# Patient Record
Sex: Female | Born: 1980 | Race: White | Hispanic: No | Marital: Married | Smoking: Never smoker
Health system: Southern US, Community
[De-identification: ages and names within clinical notes are randomized; demographics above are authoritative.]

---

## 2013-06-25 ENCOUNTER — Ambulatory Visit: Payer: Self-pay | Admitting: Physician Assistant

## 2013-06-25 LAB — RAPID STREP-A WITH REFLX: MICRO TEXT REPORT: NEGATIVE

## 2013-06-25 LAB — RAPID INFLUENZA A&B ANTIGENS

## 2013-06-27 ENCOUNTER — Emergency Department: Payer: Self-pay | Admitting: Emergency Medicine

## 2013-06-27 LAB — URINALYSIS, COMPLETE
BILIRUBIN, UR: NEGATIVE
Blood: NEGATIVE
Glucose,UR: NEGATIVE mg/dL (ref 0–75)
Ketone: NEGATIVE
Nitrite: NEGATIVE
Ph: 5 (ref 4.5–8.0)
Protein: 30
RBC,UR: 2 /HPF (ref 0–5)
SPECIFIC GRAVITY: 1.026 (ref 1.003–1.030)
Squamous Epithelial: 11
WBC UR: 8 /HPF (ref 0–5)

## 2013-06-27 LAB — BASIC METABOLIC PANEL
Anion Gap: 5 — ABNORMAL LOW (ref 7–16)
BUN: 8 mg/dL (ref 7–18)
Calcium, Total: 8.1 mg/dL — ABNORMAL LOW (ref 8.5–10.1)
Chloride: 106 mmol/L (ref 98–107)
Co2: 27 mmol/L (ref 21–32)
Creatinine: 0.75 mg/dL (ref 0.60–1.30)
EGFR (African American): >60
Glucose: 96 mg/dL (ref 65–99)
Osmolality: 274 (ref 275–301)
Potassium: 3.5 mmol/L (ref 3.5–5.1)
Sodium: 138 mmol/L (ref 136–145)

## 2013-06-27 LAB — CBC
HCT: 34.7 % — AB (ref 35.0–47.0)
HGB: 12.1 g/dL (ref 12.0–16.0)
MCH: 30.6 pg (ref 26.0–34.0)
MCHC: 34.8 g/dL (ref 32.0–36.0)
MCV: 88 fL (ref 80–100)
Platelet: 130 10*3/uL — ABNORMAL LOW (ref 150–440)
RBC: 3.96 10*6/uL (ref 3.80–5.20)
RDW: 12.2 % (ref 11.5–14.5)
WBC: 5 10*3/uL (ref 3.6–11.0)

## 2013-06-28 LAB — BETA STREP CULTURE(ARMC)

## 2013-06-30 LAB — BETA STREP CULTURE(ARMC)

## 2014-06-19 ENCOUNTER — Ambulatory Visit: Payer: Self-pay

## 2014-10-24 ENCOUNTER — Ambulatory Visit
Admit: 2014-10-24 | Discharge: 2014-10-24 | Disposition: A | Discharge status: Home / Self Care | Payer: No Typology Code available for payment source | Attending: *Deleted | Admitting: *Deleted

## 2014-10-24 ENCOUNTER — Ambulatory Visit
Admission: EM | Admit: 2014-10-24 | Discharge: 2014-10-24 | Disposition: A | Discharge status: Home / Self Care | Payer: No Typology Code available for payment source | Attending: Internal Medicine | Admitting: Internal Medicine

## 2014-10-24 ENCOUNTER — Encounter: Payer: Self-pay | Admitting: Emergency Medicine

## 2014-10-24 DIAGNOSIS — M79662 Pain in left lower leg: Secondary | ICD-10-CM | POA: Diagnosis present

## 2014-10-24 DIAGNOSIS — M5416 Radiculopathy, lumbar region: Secondary | ICD-10-CM

## 2014-10-24 MED ORDER — NAPROXEN 500 MG PO TABS: 500.0000 mg | ORAL_TABLET | Freq: Two times a day (BID) | ORAL | Status: DC

## 2014-10-24 NOTE — ED Provider Notes (Signed)
CSN: [id number]     Arrival date & time 10/24/14  1216 History   First MD Initiated Contact with Patient 10/24/14 1317     Chief Complaint  Patient presents with  . Leg Pain    left lower leg   (Consider location/radiation/quality/duration/timing/severity/associated sxs/prior Treatment) HPI   This 34 year old female resents with left-sided leg pain that she indicates anterior running alongside the anterior tibial spine into her foot into the big toe. She relates that on 10/14/2014 she her family took a trip to [place] spanning 8 hours in the car. She felt the pain again at that time which she describes as a throbbing burning type pain. On the return trip was even worse and entire trip took approximately 20 hours. Since then she has had continual throbbing pain into her lower left extremity anteriorly. She's had no swelling no warmth or calf pain. She has a history of sciatica and has been treated chiropractic with adjustments for "hips that were out of place" according to the chiropractor. He denies any back pain buttocks pain or posterior thigh pain. Besides her long automobile trip she frequently lifts her 35 pound son. Her mother and a nurse scared her regarding a possible DVT from the long car trip. She takes no birth control and does not smoke.       History reviewed. No pertinent past medical history.    Past Surgical History  Procedure Laterality Date  . Cesarean section     History reviewed. No pertinent family history. History  Substance Use Topics  . Smoking status: Never Smoker   . Smokeless tobacco: Not on file  . Alcohol Use: No   OB History    No data available     Review of Systems  All other systems reviewed and are negative.   Allergies  Review of patient's allergies indicates no known allergies.  Home Medications   Prior to Admission medications   Medication Sig Start Date End Date Taking? Authorizing Provider  naproxen (NAPROSYN) 500 MG tablet  Take 1 tablet (500 mg total) by mouth 2 (two) times daily with a meal. 10/24/14   Lutricia Feil, PA-C   BP 121/88 mmHg  Pulse 73  Temp(Src) 98 F (36.7 C) (Tympanic)  Resp 16  Ht 5\' 2"  (1.575 m)  Wt 138 lb (62.596 kg)  BMI 25.23 kg/m2  SpO2 100%  LMP 10/21/2014 (Exact Date) Physical Exam  Constitutional: She appears well-developed and well-nourished.  HENT:  Head: Normocephalic and atraumatic.  Eyes: EOM are normal. Pupils are equal, round, and reactive to light.  Musculoskeletal:  Examination of the lumbar spine is performed with Harriett Sine, RN as chaperone the patient exhibits a level pelvis in stance. For flexion and lateral flexion are full and comfortable. She has no tenderness of the paraspinous muscles or the  sacroiliac area. Is no sciatic notch tenderness. Is able to heel and toe walk. There is no hip esthesia to light touch in the lower extremities. DTRs are 2+ over 4 and bilaterally symmetrical. EHL anterior tibialis and peroneal are strong to clinical testing. Straight leg raise testing is 90 negative on the right. Her leg raise testing is positive at 90 with foot dorsiflexion on the left reproducing her pain along the anterior leg.    ED Course  Procedures (including critical care time) Labs Review Labs Reviewed - No data to display  Imaging Review No results found.   MDM   1. Left lumbar radiculitis    This patient presents  with a confusing picture of symptoms and findings. Her calf measurements are within 5 mm of each other. She has no tenderness warmth or erythema of her calves. She is very concerned from talking to other people about the possibility of having a deep venous thrombosis as her pain began after her long car trip. Her Wells risk score is very low and told her this but will send her for the noninvasive venous Doppler ultrasound to definitively rule out DVT. Most likely source of her pain in her leg is from a left lumbar radiculitis despite no back pain and  very negative findings on exam today. She has no neurological deficit. I will treat this with anti-inflammatory medications she needs to limit lifting of her 35 pound son and not sit for prolonged periods lift or bend. Returning  to her chiropractor may be beneficial.    Lutricia Feil, PA-C 10/24/14 1435

## 2014-10-24 NOTE — ED Notes (Signed)
Patient c/o left lower leg pain for a week.  Patient denies injury.

## 2015-03-03 IMAGING — CR DG CHEST 2V
1 series · 2 of 2 positions shown · non-contrast
Comparison: None.

CLINICAL DATA: Fever, cough, runny nose

EXAM:
CHEST  2 VIEW

[Series 1: pa · 0.17mm/px · 2 of 2 slices shown]
[im 1/2]
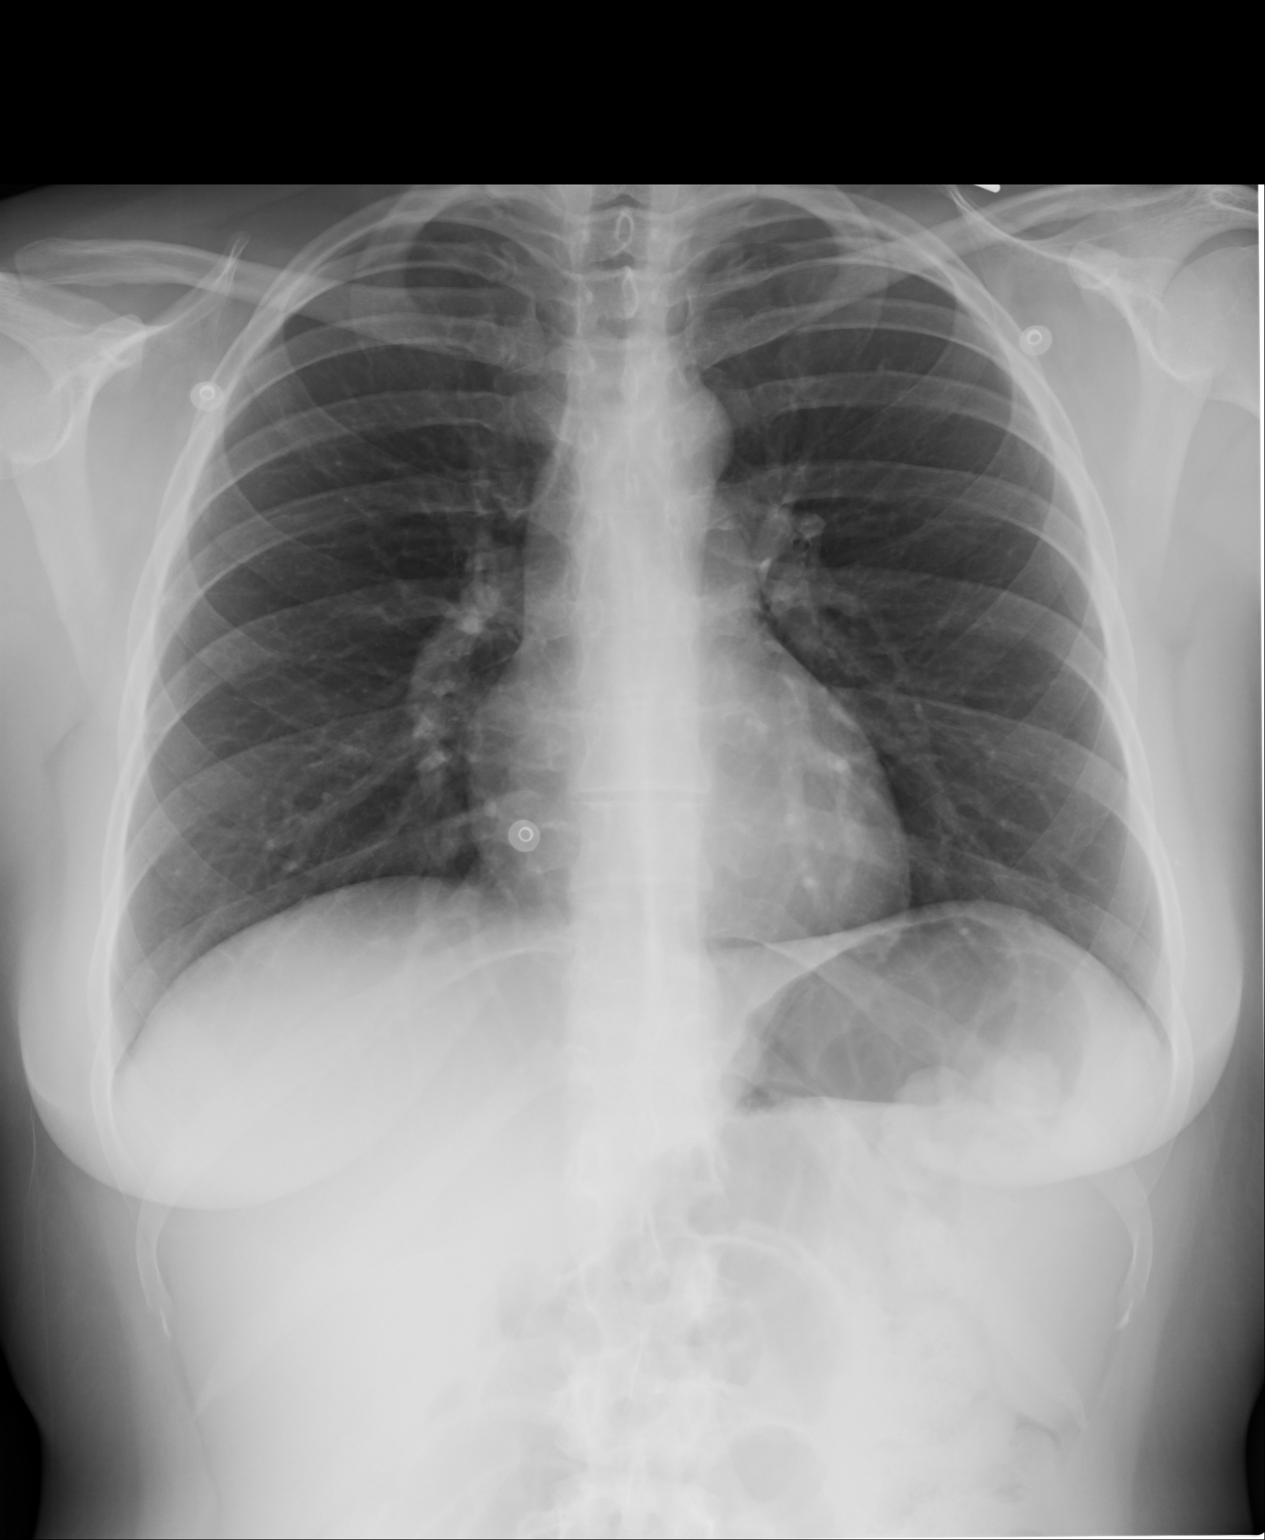
[im 2/2]
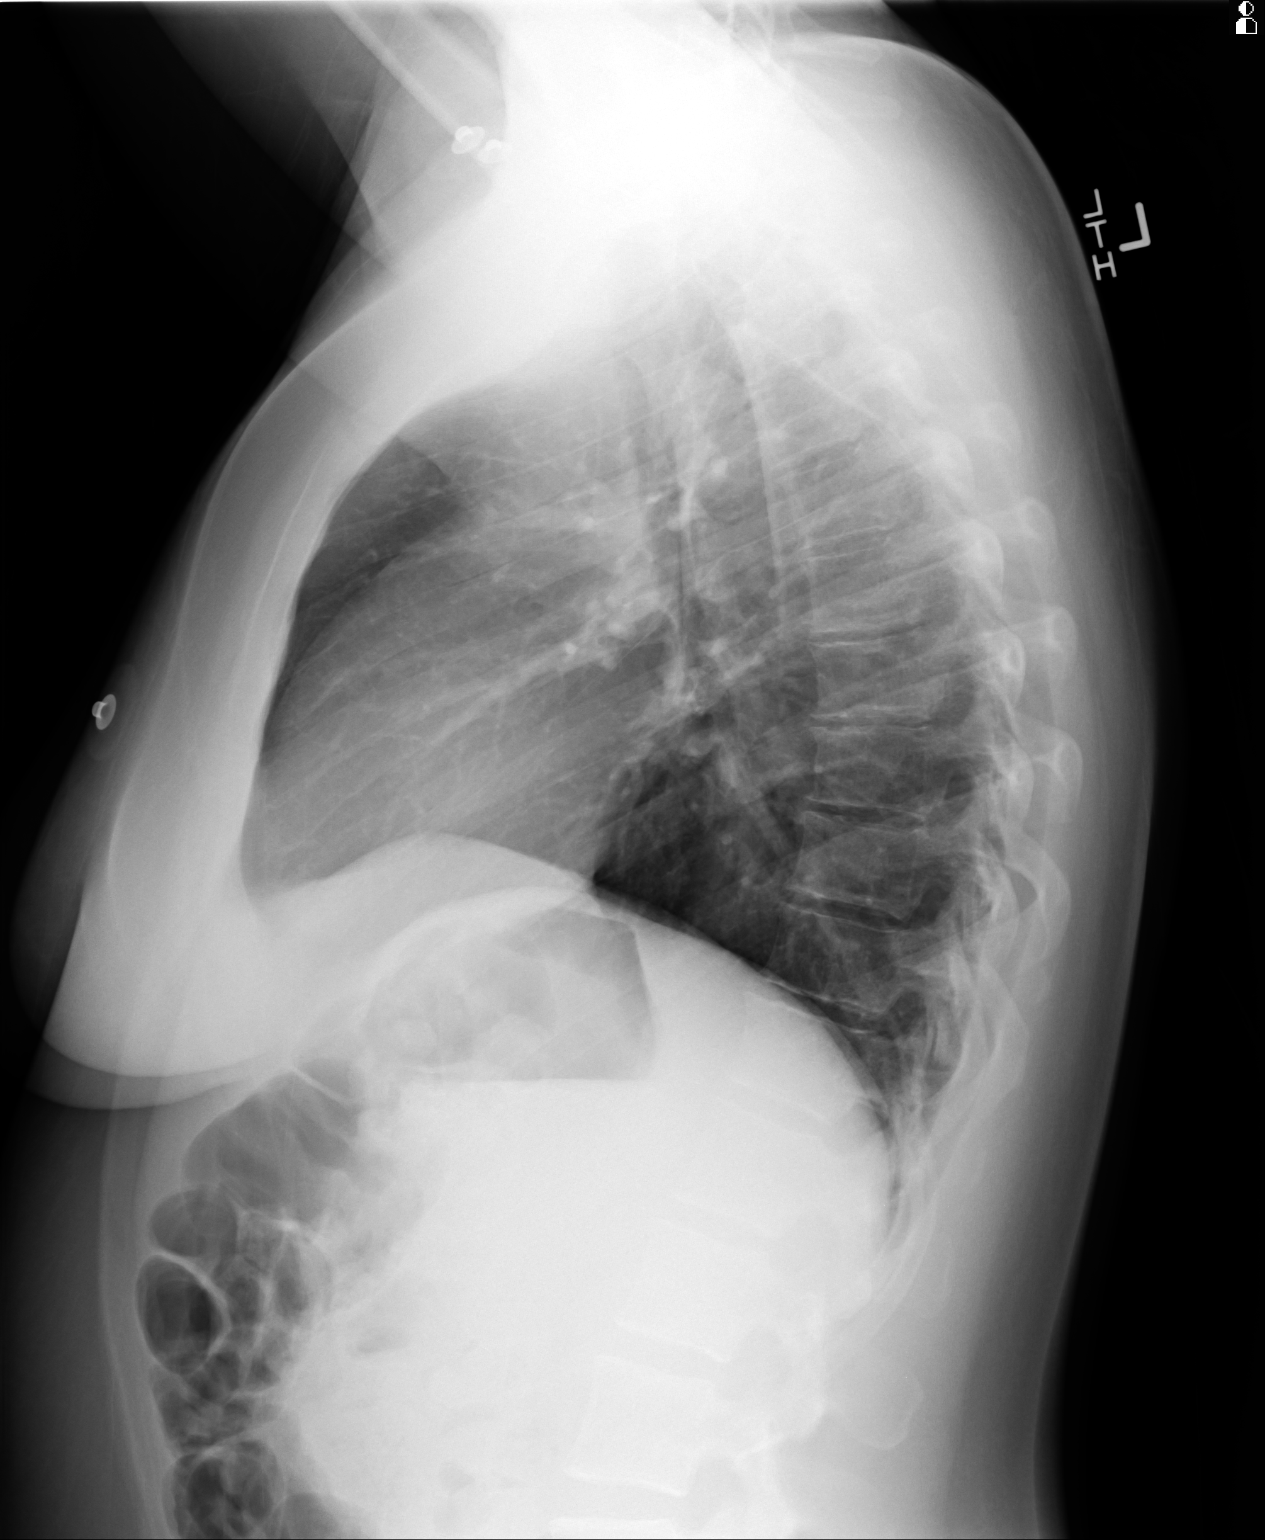

[2 of 2 positions shown; findings below may reference images not displayed]

FINDINGS: Cardiomediastinal silhouette is unremarkable. No acute infiltrate or
pleural effusion. No pulmonary edema. Bony thorax is unremarkable.
IMPRESSION: No active cardiopulmonary disease.

## 2017-01-03 IMAGING — US US EXTREM LOW VENOUS*L*
1 series · 13 of 24 positions shown · non-contrast
Comparison: None.

CLINICAL DATA: Acute left lower leg pain, recent long distance car
travel



[Series 1: us extrem low venous*left* · 0.07mm/px · 13 of 25 slices shown]
[im 1/25]
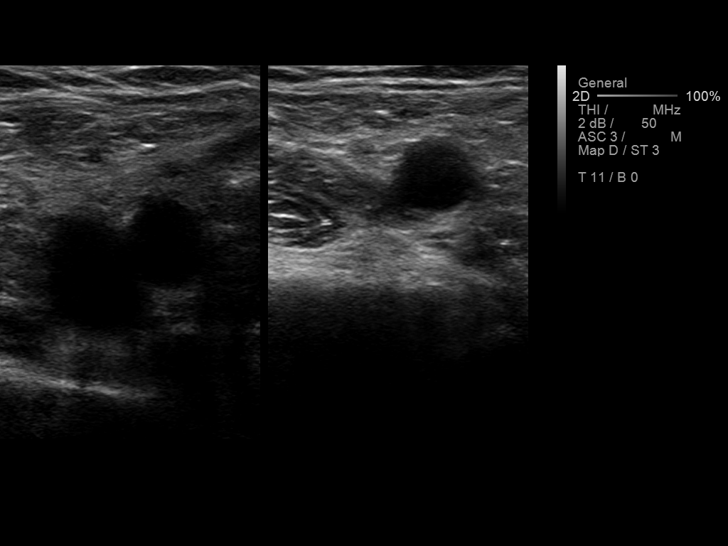
[im 3/25]
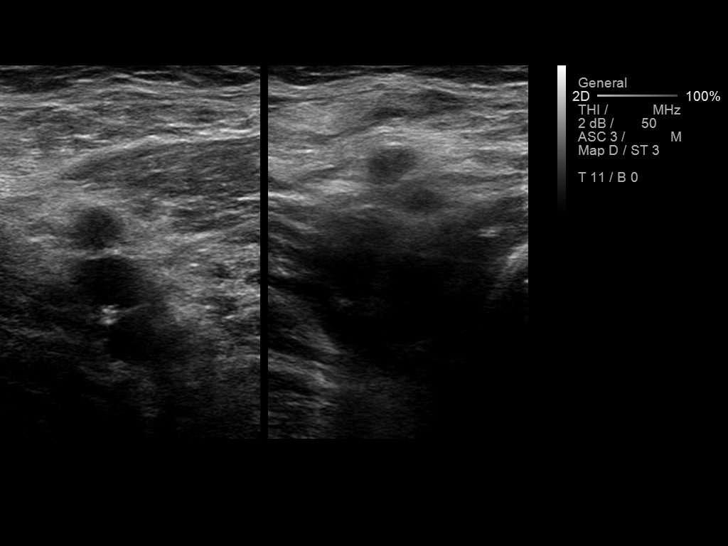
[im 5/25]
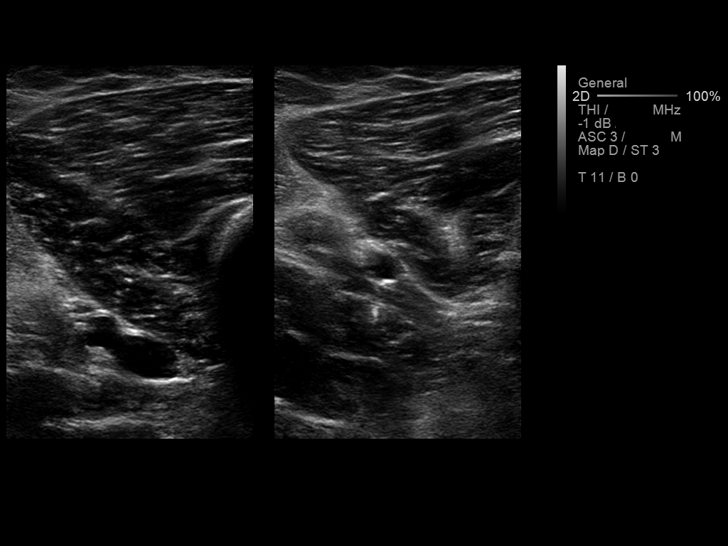
[im 7/25]
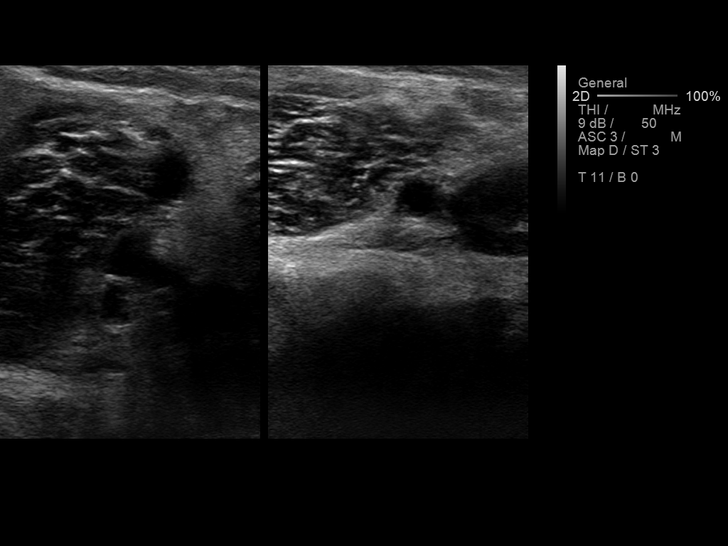
[im 9/25]
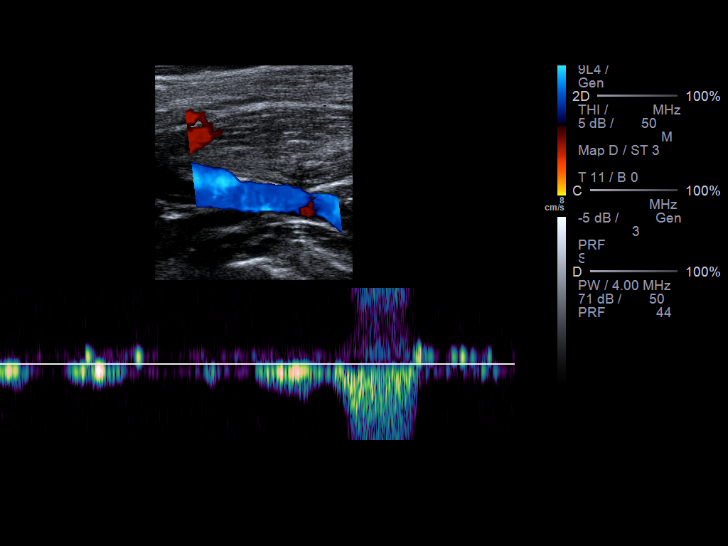
[im 11/25]
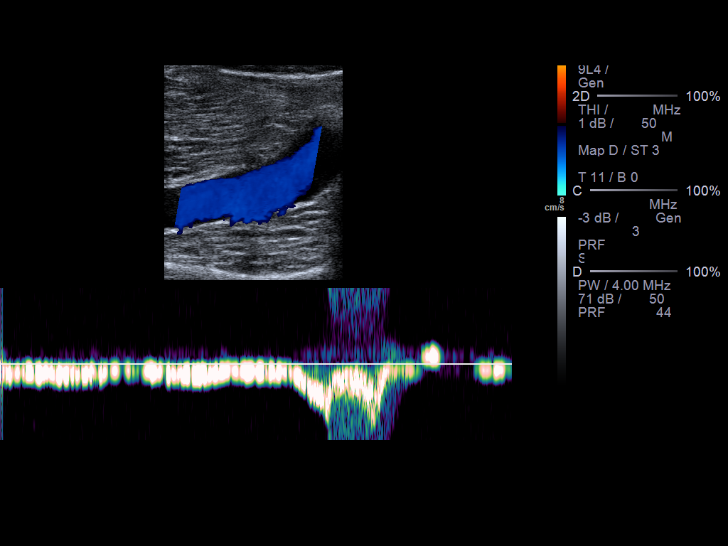
[im 13/25]
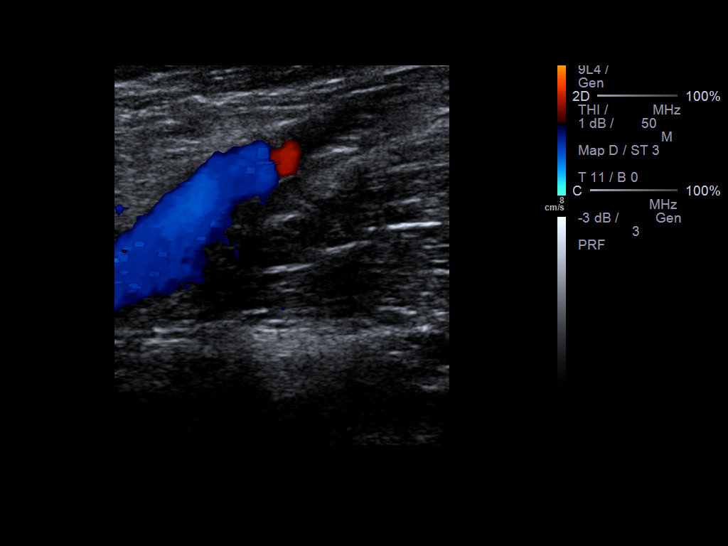
[im 14/25]
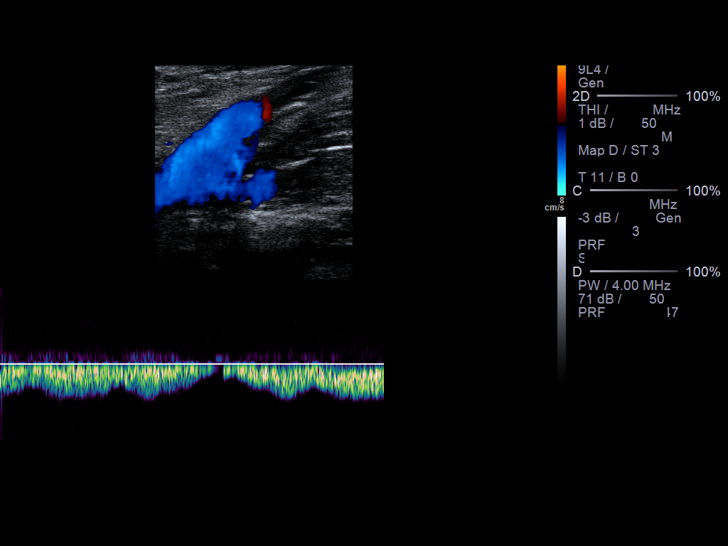
[im 16/25]
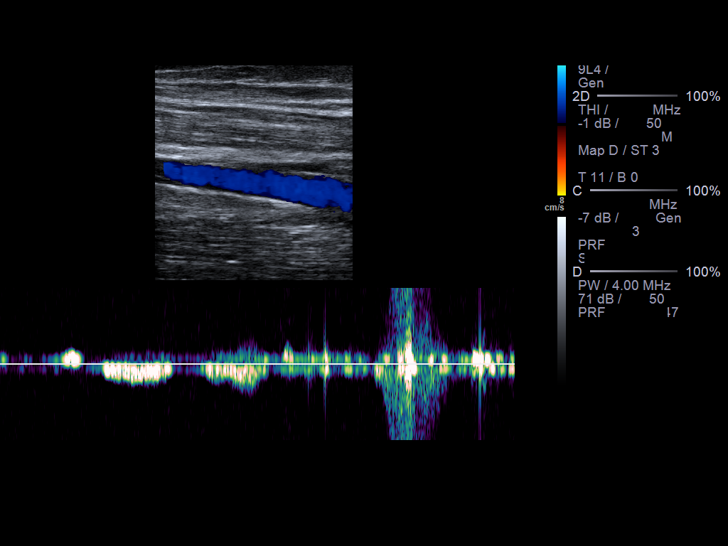
[im 18/25]
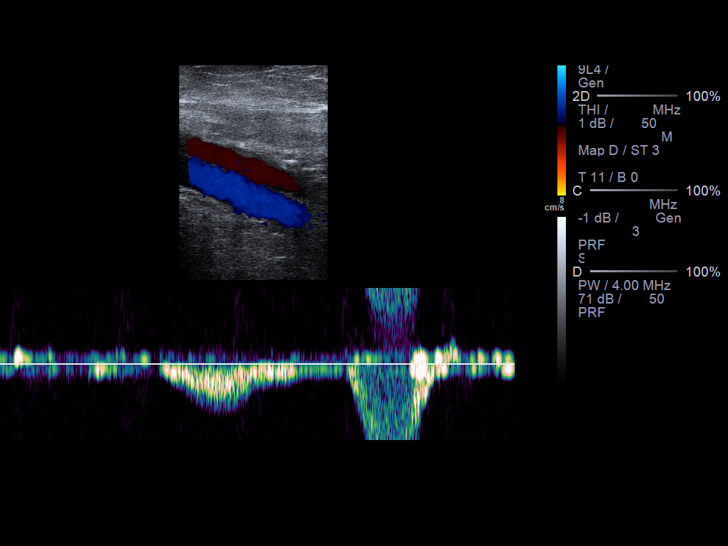
[im 20/25]
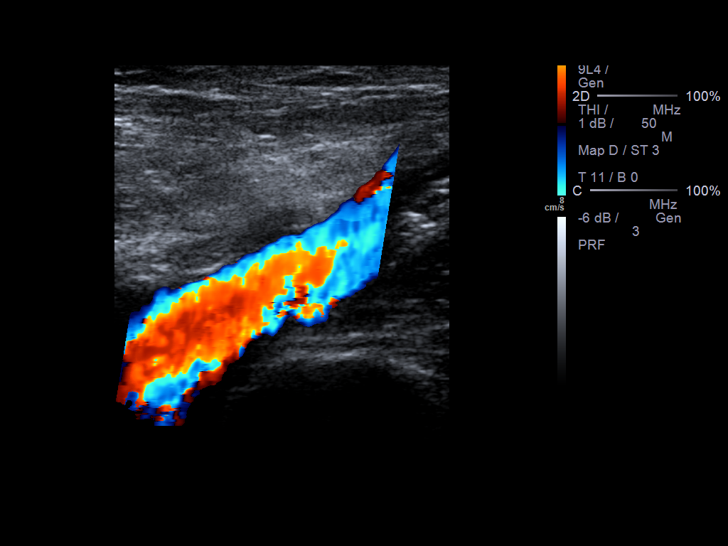
[im 22/25]
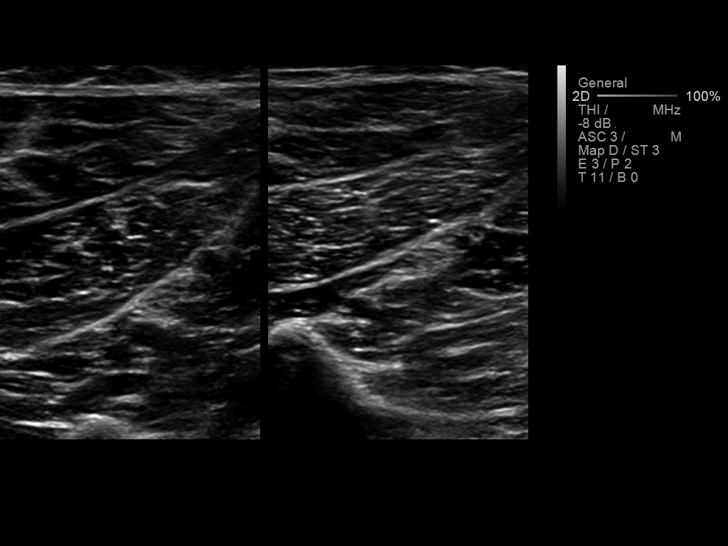
[im 25/25]
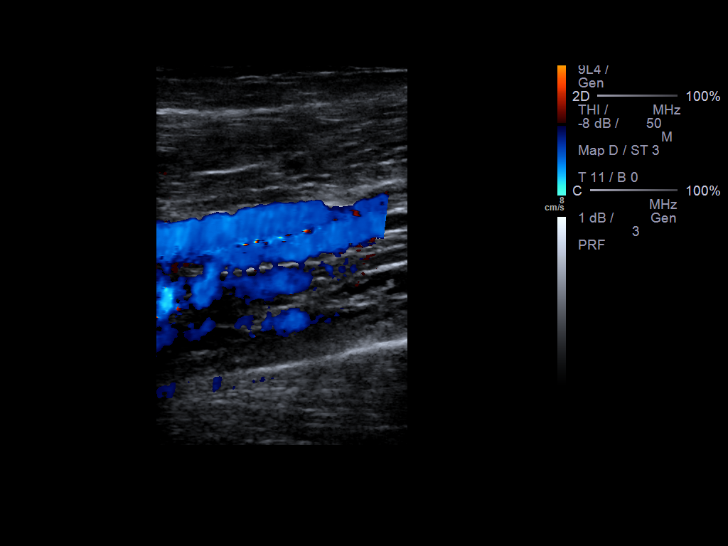

[13 of 24 positions shown; findings below may reference images not displayed]

FINDINGS: Contralateral Common Femoral Vein: Respiratory phasicity is normal
and symmetric with the symptomatic side. No evidence of thrombus.
Normal compressibility.

Common Femoral Vein: No evidence of thrombus. Normal
compressibility, respiratory phasicity and response to augmentation.

Saphenofemoral Junction: No evidence of thrombus. Normal
compressibility and flow on color Doppler imaging.

Profunda Femoral Vein: No evidence of thrombus. Normal
compressibility and flow on color Doppler imaging.

Femoral Vein: No evidence of thrombus. Normal compressibility,
respiratory phasicity and response to augmentation.

Popliteal Vein: No evidence of thrombus. Normal compressibility,
respiratory phasicity and response to augmentation.

Calf Veins: No evidence of thrombus. Normal compressibility and flow
on color Doppler imaging.

Superficial Great Saphenous Vein: No evidence of thrombus. Normal
compressibility and flow on color Doppler imaging.

Venous Reflux:  None.

Other Findings:  None.
IMPRESSION: No evidence of deep venous thrombosis.

## 2018-04-03 ENCOUNTER — Ambulatory Visit
Admission: EM | Admit: 2018-04-03 | Discharge: 2018-04-03 | Disposition: A | Discharge status: Home / Self Care | Payer: 59

## 2018-04-03 ENCOUNTER — Other Ambulatory Visit: Payer: Self-pay

## 2018-04-03 ENCOUNTER — Encounter: Payer: Self-pay | Admitting: Emergency Medicine

## 2018-04-03 DIAGNOSIS — B9789 Other viral agents as the cause of diseases classified elsewhere: Secondary | ICD-10-CM | POA: Diagnosis not present

## 2018-04-03 DIAGNOSIS — J069 Acute upper respiratory infection, unspecified: Secondary | ICD-10-CM | POA: Diagnosis not present

## 2018-04-03 MED ORDER — HYDROCOD POLST-CPM POLST ER 10-8 MG/5ML PO SUER: 5.0000 mL | Freq: Every evening | ORAL | 0 refills | Status: DC | PRN

## 2018-04-03 MED ORDER — DOXYCYCLINE HYCLATE 100 MG PO CAPS: 100.0000 mg | ORAL_CAPSULE | Freq: Two times a day (BID) | ORAL | 0 refills | Status: DC

## 2018-04-03 NOTE — ED Provider Notes (Signed)
[facility] ____________________________________________  Time seen: Approximately 8:36 AM  I have reviewed the triage vital signs and the nursing notes.   HISTORY  Chief Complaint Cough   HPI Sierra Gutierrez is a 37 y.o. female presents for evaluation of 2 weeks of cough and congestion.  States initially felt like it was a cold, but reports symptoms have continued.  States nasal drainage, chest congestion, cough, some sore throat with coughing.  Denies known fevers. Denies pain. States cough is occasionally productive but mostly dry.  Cough is disrupting sleep and making sleep very difficult.  Reports her son recently had some similar sicknesses.  Has tried multiple over-the-counter cough and congestion medicines without relief.  Denies other aggravating alleviating factors.  Denies chest pain or shortness of breath.  Denies recent sickness or recent antibiotic use.  Denies current pregnancy.  Cyndie MullSantayana, Gloria Patricia, DO: PCP Patient's last menstrual period was 03/13/2018 (approximate).   History reviewed. No pertinent past medical history. Denies   There are no active problems to display for this patient.   Past Surgical History:  Procedure Laterality Date  . CESAREAN SECTION       No current facility-administered medications for this encounter.   Current Outpatient Medications:  .  FLUoxetine (PROZAC) 20 MG capsule, Take by mouth., Disp: , Rfl:  .  chlorpheniramine-HYDROcodone (TUSSIONEX PENNKINETIC ER) 10-8 MG/5ML SUER, Take 5 mLs by mouth at bedtime as needed for cough. do not drive or operate machinery while taking as can cause drowsiness., Disp: 50 mL, Rfl: 0 .  doxycycline (VIBRAMYCIN) 100 MG capsule, Take 1 capsule (100 mg total) by mouth 2 (two) times daily., Disp: 20 capsule, Rfl: 0  Allergies Patient has no known allergies.  History reviewed. No pertinent family history.  Social History Social History   Tobacco Use  . Smoking  status: Never Smoker  . Smokeless tobacco: Never Used  Substance Use Topics  . Alcohol use: No  . Drug use: Not on file    Review of Systems Constitutional: No fever ENT: As above.  Cardiovascular: Denies chest pain. Respiratory: Denies shortness of breath. Gastrointestinal: No abdominal pain.   Musculoskeletal: Negative for back pain. Skin: Negative for rash. .  ____________________________________________   PHYSICAL EXAM:  VITAL SIGNS: ED Triage Vitals  Enc Vitals Group     BP 04/03/18 0813 117/84     Pulse Rate 04/03/18 0813 85     Resp 04/03/18 0813 14     Temp 04/03/18 0813 97.7 F (36.5 C)     Temp Source 04/03/18 0813 Oral     SpO2 04/03/18 0813 99 %     Weight 04/03/18 0809 225 lb (102.1 kg)     Height 04/03/18 0809 5\' 2"  (1.575 m)     Head Circumference --      Peak Flow --      Pain Score 04/03/18 0809 0     Pain Loc --      Pain Edu? --      Excl. in [place]? --     Constitutional: Alert and oriented. Well appearing and in no acute distress. Eyes: Conjunctivae are normal.  Head: Atraumatic. No sinus tenderness to palpation. No swelling. No erythema.  Ears: no erythema, normal TMs bilaterally.   Nose:Nasal congestion  Mouth/Throat: Mucous membranes are moist. No pharyngeal erythema. No tonsillar swelling or exudate.  Neck: No stridor.  No cervical spine tenderness to palpation. Hematological/Lymphatic/Immunilogical: No cervical lymphadenopathy. Cardiovascular: Normal rate, regular rhythm. Grossly normal heart sounds.  Good peripheral circulation. Respiratory: Normal respiratory effort.  No retractions. No wheezes.  Scattered rhonchi.  Dry intermittent cough in room. Speaks in complete sentences. Good air movement.  Musculoskeletal: Ambulatory with steady gait.  Neurologic:  Normal speech and language. No gait instability. Skin:  Skin appears warm, dry and intact. No rash noted. Psychiatric: Mood and affect are normal. Speech and behavior are normal.     ___________________________________________   LABS (all labs ordered are listed, but only abnormal results are displayed)  Labs Reviewed - No data to display ____________________________________________   PROCEDURES Procedures    INITIAL IMPRESSION / ASSESSMENT AND PLAN / ED COURSE  Pertinent labs & imaging results that were available during my care of the patient were reviewed by me and considered in my medical decision making (see chart for details).  Well-appearing patient.  No acute distress.  Suspect recent viral illness.  As with continued scattered rhonchi will start antibiotic therapy.  Will treat with oral doxycycline, continue over-the-counter cough and congestion medications during the day and PRN Tussionex at night.Discussed indication, risks and benefits of medications with patient.  Discussed follow up with Primary care physician this week as needed. Discussed follow up and return parameters including no resolution or any worsening concerns. Patient verbalized understanding and agreed to plan.   ____________________________________________   FINAL CLINICAL IMPRESSION(S) / ED DIAGNOSES  Final diagnoses:  Viral URI with cough     ED Discharge Orders         Ordered    chlorpheniramine-HYDROcodone (TUSSIONEX PENNKINETIC ER) 10-8 MG/5ML SUER  At bedtime PRN     04/03/18 0825    doxycycline (VIBRAMYCIN) 100 MG capsule  2 times daily     04/03/18 0825           Note: This dictation was prepared with Dragon dictation along with smaller phrase technology. Any transcriptional errors that result from this process are unintentional.         Renford Dills, NP 04/03/18 0840

## 2018-04-03 NOTE — Discharge Instructions (Addendum)
Take medication as prescribed. Rest. Drink plenty of fluids.  ° °Follow up with your primary care physician this week as needed. Return to Urgent care for new or worsening concerns.  ° °

## 2018-04-03 NOTE — ED Triage Notes (Signed)
Patient c/o cough, chest congestion, sore throat for the past 2 weeks.  Patient denies fevers.

## 2022-02-19 ENCOUNTER — Ambulatory Visit
Admission: EM | Admit: 2022-02-19 | Discharge: 2022-02-19 | Disposition: A | Discharge status: Home / Self Care | Payer: Managed Care, Other (non HMO) | Attending: Physician Assistant | Admitting: Physician Assistant

## 2022-02-19 DIAGNOSIS — H66001 Acute suppurative otitis media without spontaneous rupture of ear drum, right ear: Secondary | ICD-10-CM

## 2022-02-19 DIAGNOSIS — R051 Acute cough: Secondary | ICD-10-CM

## 2022-02-19 DIAGNOSIS — J029 Acute pharyngitis, unspecified: Secondary | ICD-10-CM | POA: Diagnosis not present

## 2022-02-19 MED ORDER — PROMETHAZINE-DM 6.25-15 MG/5ML PO SYRP: 5.0000 mL | ORAL_SOLUTION | Freq: Four times a day (QID) | ORAL | 0 refills | Status: DC | PRN

## 2022-02-19 MED ORDER — AMOXICILLIN-POT CLAVULANATE 875-125 MG PO TABS: 1.0000 | ORAL_TABLET | Freq: Two times a day (BID) | ORAL | 0 refills | Status: AC

## 2022-02-19 NOTE — ED Triage Notes (Signed)
Pt c/o worsening sore throat, cough, and bilateral ear pressure and pain x1week

## 2022-02-19 NOTE — ED Provider Notes (Signed)
[facility]    CSN: [id number] Arrival date & time: 02/19/22  1105      History   Chief Complaint Chief Complaint  Patient presents with   Ear Pain   Sore Throat    HPI Sierra Gutierrez is a 41 y.o. female presenting for bilateral ear pain, sore throat, cough, and congestion x 1 week. Reports worsening ear pain. Denies fever, weakness, sinus pain, SOB. Taking OTC decongestants without relief. Husband has similar symptoms.   HPI  History reviewed. No pertinent past medical history.  There are no problems to display for this patient.   Past Surgical History:  Procedure Laterality Date   CESAREAN SECTION      OB History   No obstetric history on file.      Home Medications    Prior to Admission medications   Medication Sig Start Date End Date Taking? Authorizing Provider  amoxicillin-clavulanate (AUGMENTIN) 875-125 MG tablet Take 1 tablet by mouth every 12 (twelve) hours for 7 days. 02/19/22 02/26/22 Yes Danton Clap, PA-C  promethazine-dextromethorphan (PROMETHAZINE-DM) 6.25-15 MG/5ML syrup Take 5 mLs by mouth 4 (four) times daily as needed. 02/19/22  Yes Danton Clap, PA-C  FLUoxetine (PROZAC) 20 MG capsule Take by mouth. 11/18/17 11/18/18  [provider]    Family History History reviewed. No pertinent family history.  Social History Social History   Tobacco Use   Smoking status: Never   Smokeless tobacco: Never  Vaping Use   Vaping Use: Never used  Substance Use Topics   Alcohol use: No   Drug use: Never     Allergies   Patient has no known allergies.   Review of Systems Review of Systems  Constitutional:  Positive for fatigue. Negative for chills, diaphoresis and fever.  HENT:  Positive for congestion, ear pain, rhinorrhea and sore throat. Negative for sinus pressure and sinus pain.   Respiratory:  Positive for cough. Negative for shortness of breath.   Gastrointestinal:  Negative for abdominal pain,  nausea and vomiting.  Musculoskeletal:  Negative for arthralgias and myalgias.  Skin:  Negative for rash.  Neurological:  Negative for weakness and headaches.  Hematological:  Negative for adenopathy.     Physical Exam Triage Vital Signs ED Triage Vitals  Enc Vitals Group     BP      Pulse      Resp      Temp      Temp src      SpO2      Weight      Height      Head Circumference      Peak Flow      Pain Score      Pain Loc      Pain Edu?      Excl. in [place]?    No data found.  Updated Vital Signs BP 137/80 (BP Location: Left Arm)   Pulse 65   Temp 99.1 F (37.3 C) (Oral)   Resp 18   Ht 5\' 2"  (1.575 m)   Wt 205 lb (93 kg)   LMP 02/19/2022   SpO2 98%   BMI 37.49 kg/m   Physical Exam Vitals and nursing note reviewed.  Constitutional:      General: She is not in acute distress.    Appearance: Normal appearance. She is not ill-appearing or toxic-appearing.  HENT:     Head: Normocephalic and atraumatic.     Right Ear: A middle ear effusion is present. Tympanic  membrane is erythematous and bulging.     Left Ear: A middle ear effusion is present.     Nose: Congestion present.     Mouth/Throat:     Mouth: Mucous membranes are moist.     Pharynx: Oropharynx is clear. Posterior oropharyngeal erythema present.  Eyes:     General: No scleral icterus.       Right eye: No discharge.        Left eye: No discharge.     Conjunctiva/sclera: Conjunctivae normal.  Cardiovascular:     Rate and Rhythm: Normal rate and regular rhythm.     Heart sounds: Normal heart sounds.  Pulmonary:     Effort: Pulmonary effort is normal. No respiratory distress.     Breath sounds: Normal breath sounds.  Musculoskeletal:     Cervical back: Neck supple.  Skin:    General: Skin is dry.  Neurological:     General: No focal deficit present.     Mental Status: She is alert. Mental status is at baseline.     Motor: No weakness.     Gait: Gait normal.  Psychiatric:        Mood and Affect:  Mood normal.        Behavior: Behavior normal.        Thought Content: Thought content normal.      UC Treatments / Results  Labs (all labs ordered are listed, but only abnormal results are displayed) Labs Reviewed - No data to display  EKG   Radiology No results found.  Procedures Procedures (including critical care time)  Medications Ordered in UC Medications - No data to display  Initial Impression / Assessment and Plan / UC Course  I have reviewed the triage vital signs and the nursing notes.  Pertinent labs & imaging results that were available during my care of the patient were reviewed by me and considered in my medical decision making (see chart for details).   41 y/o female presents for 1 week history of cough and congestion with worsening ear pain and throat pain.   VSS. Patient overall well appearing. +nasal congestion on exam with erythema of posterior pharynx, erythema/bulging/effusion of right TM and effusion of left TM. Chest CTA and heart RRR.   Viral URI and bacterial AOM. Treating with Augmentin and promethazine DM. Supportive care. Reviewed return precautions.    Final Clinical Impressions(s) / UC Diagnoses   Final diagnoses:  Acute suppurative otitis media of right ear without spontaneous rupture of tympanic membrane, recurrence not specified  Sore throat  Acute cough   Discharge Instructions   None    ED Prescriptions     Medication Sig Dispense Auth. Provider   amoxicillin-clavulanate (AUGMENTIN) 875-125 MG tablet Take 1 tablet by mouth every 12 (twelve) hours for 7 days. 14 tablet Shirlee Latch, PA-C   promethazine-dextromethorphan (PROMETHAZINE-DM) 6.25-15 MG/5ML syrup Take 5 mLs by mouth 4 (four) times daily as needed. 118 mL Shirlee Latch, PA-C      PDMP not reviewed this encounter.   Shirlee Latch, PA-C 02/19/22 1250

## 2023-12-14 ENCOUNTER — Encounter: Payer: Self-pay | Admitting: Emergency Medicine

## 2023-12-14 ENCOUNTER — Ambulatory Visit
Admission: EM | Admit: 2023-12-14 | Discharge: 2023-12-14 | Disposition: A | Discharge status: Home / Self Care | Attending: Physician Assistant | Admitting: Physician Assistant

## 2023-12-14 DIAGNOSIS — H6502 Acute serous otitis media, left ear: Secondary | ICD-10-CM | POA: Diagnosis not present

## 2023-12-14 DIAGNOSIS — H9202 Otalgia, left ear: Secondary | ICD-10-CM

## 2023-12-14 DIAGNOSIS — H6992 Unspecified Eustachian tube disorder, left ear: Secondary | ICD-10-CM | POA: Diagnosis not present

## 2023-12-14 MED ORDER — PREDNISONE 10 MG PO TABS: ORAL_TABLET | ORAL | 0 refills | Status: DC

## 2023-12-14 NOTE — ED Triage Notes (Addendum)
 Pt c/o left ear pain. Started about 3 days ago. She states it kept her up last night. She states she just got back from a cruise. Denies drainage. Denies fever.

## 2023-12-14 NOTE — Discharge Instructions (Signed)
-  Begin claritin, flonase and ibuprofen, warm compresses -If no improvement in a week try the prednisone

## 2023-12-14 NOTE — ED Provider Notes (Signed)
 [facility]    CSN: [id number] Arrival date & time: 12/14/23  0934      History   Chief Complaint Chief Complaint  Patient presents with   Otalgia    left    HPI Sierra Gutierrez is a 43 y.o. female presenting for sharp intermittent left sided ear pain x 3 days. Patient recently returned from a cruise, but says she did not fly there and denies getting her head wet when she went swimming. Denies fever, cough, congestion, sore throat, sinus pain, hearing loss and ear drainage. Taking OTC tylenol without relief.    HPI  History reviewed. No pertinent past medical history.  There are no active problems to display for this patient.   Past Surgical History:  Procedure Laterality Date   CESAREAN SECTION      OB History   No obstetric history on file.      Home Medications    Prior to Admission medications   Medication Sig Start Date End Date Taking? Authorizing Provider  FLUoxetine (PROZAC) 20 MG capsule Take by mouth. 11/18/17 12/14/23 Yes [provider]  predniSONE  (DELTASONE ) 10 MG tablet Take 6 tabs po on day 1 and decrease by 1 tab daily until complete 12/14/23  Yes Arvis Jolan NOVAK, PA-C  promethazine -dextromethorphan (PROMETHAZINE -DM) 6.25-15 MG/5ML syrup Take 5 mLs by mouth 4 (four) times daily as needed. 02/19/22   Arvis Jolan NOVAK, PA-C    Family History History reviewed. No pertinent family history.  Social History Social History   Tobacco Use   Smoking status: Never   Smokeless tobacco: Never  Vaping Use   Vaping status: Never Used  Substance Use Topics   Alcohol use: No   Drug use: Never     Allergies   Patient has no known allergies.   Review of Systems Review of Systems  Constitutional:  Negative for chills, diaphoresis, fatigue and fever.  HENT:  Positive for ear pain. Negative for congestion, ear discharge, hearing loss, rhinorrhea, sinus pressure, sinus pain and sore throat.   Respiratory:  Negative for  cough.   Neurological:  Negative for dizziness, weakness and headaches.  Hematological:  Negative for adenopathy.     Physical Exam Triage Vital Signs ED Triage Vitals  Enc Vitals Group     BP      Pulse      Resp      Temp      Temp src      SpO2      Weight      Height      Head Circumference      Peak Flow      Pain Score      Pain Loc      Pain Edu?      Excl. in [place]?    No data found.  Updated Vital Signs BP 131/88 (BP Location: Left Arm)   Pulse 71   Temp 98.5 F (36.9 C) (Oral)   Resp 16   Ht 5' 2 (1.575 m)   Wt 205 lb 0.4 oz (93 kg)   LMP 12/10/2023 (Approximate)   SpO2 98%   BMI 37.50 kg/m   Physical Exam Vitals and nursing note reviewed.  Constitutional:      General: She is not in acute distress.    Appearance: Normal appearance. She is not ill-appearing or toxic-appearing.  HENT:     Head: Normocephalic and atraumatic.     Right Ear: Ear canal and external ear normal. No  middle ear effusion. Tympanic membrane is not erythematous or bulging.     Left Ear: Ear canal and external ear normal. A middle ear effusion is present. Tympanic membrane is not erythematous or bulging.     Nose: Mucosal edema and congestion present.     Mouth/Throat:     Mouth: Mucous membranes are moist.     Pharynx: Oropharynx is clear. No posterior oropharyngeal erythema.  Eyes:     General: No scleral icterus.       Right eye: No discharge.        Left eye: No discharge.     Conjunctiva/sclera: Conjunctivae normal.  Cardiovascular:     Rate and Rhythm: Normal rate.  Pulmonary:     Effort: Pulmonary effort is normal. No respiratory distress.  Musculoskeletal:     Cervical back: Neck supple.  Skin:    General: Skin is dry.  Neurological:     General: No focal deficit present.     Mental Status: She is alert. Mental status is at baseline.     Motor: No weakness.     Gait: Gait normal.  Psychiatric:        Mood and Affect: Mood normal.        Behavior: Behavior  normal.      UC Treatments / Results  Labs (all labs ordered are listed, but only abnormal results are displayed) Labs Reviewed - No data to display  EKG   Radiology No results found.  Procedures Procedures (including critical care time)  Medications Ordered in UC Medications - No data to display  Initial Impression / Assessment and Plan / UC Course  I have reviewed the triage vital signs and the nursing notes.  Pertinent labs & imaging results that were available during my care of the patient were reviewed by me and considered in my medical decision making (see chart for details).   43 y/o female presents for 3 day history of sharp intermittent left sided ear pain. No URI symptoms, ear drainage or hearing loss.   VSS. Patient overall well appearing. +nasal congestion on exam with left sided nasal mucosal edema. Left TM with effusion .  Acute serous otitis and eustachian tube dysfunction. Supportive care. Advised claritin, flonase and ibuprofen. If no improvement in a week, advised filling and taking prednisone  taper. Reviewed return precautions.    Final Clinical Impressions(s) / UC Diagnoses   Final diagnoses:  Otalgia of left ear  Dysfunction of left eustachian tube  Acute serous otitis media of left ear, recurrence not specified     Discharge Instructions      -Begin claritin, flonase and ibuprofen, warm compresses -If no improvement in a week try the prednisone      ED Prescriptions     Medication Sig Dispense Auth. Provider   predniSONE  (DELTASONE ) 10 MG tablet Take 6 tabs po on day 1 and decrease by 1 tab daily until complete 21 tablet Arvis Jolan NOVAK, PA-C      PDMP not reviewed this encounter.      Arvis Jolan NOVAK, PA-C 12/14/23 1028

## 2024-01-27 ENCOUNTER — Ambulatory Visit
Admission: EM | Admit: 2024-01-27 | Discharge: 2024-01-27 | Disposition: A | Discharge status: Home / Self Care | Attending: Emergency Medicine | Admitting: Emergency Medicine

## 2024-01-27 ENCOUNTER — Ambulatory Visit: Payer: Self-pay | Admitting: Emergency Medicine

## 2024-01-27 DIAGNOSIS — Z20822 Contact with and (suspected) exposure to covid-19: Secondary | ICD-10-CM | POA: Diagnosis present

## 2024-01-27 DIAGNOSIS — J069 Acute upper respiratory infection, unspecified: Secondary | ICD-10-CM | POA: Diagnosis not present

## 2024-01-27 LAB — RESP PANEL BY RT-PCR (FLU A&B, COVID) ARPGX2
Influenza A by PCR: NEGATIVE
Influenza B by PCR: NEGATIVE
SARS Coronavirus 2 by RT PCR: NEGATIVE

## 2024-01-27 MED ORDER — IBUPROFEN 600 MG PO TABS: 600.0000 mg | ORAL_TABLET | Freq: Four times a day (QID) | ORAL | 0 refills | Status: AC | PRN

## 2024-01-27 MED ORDER — FLUTICASONE PROPIONATE 50 MCG/ACT NA SUSP: 2.0000 | Freq: Every day | NASAL | 0 refills | Status: AC

## 2024-01-27 MED ORDER — HYDROCOD POLI-CHLORPHE POLI ER 10-8 MG/5ML PO SUER: 5.0000 mL | Freq: Two times a day (BID) | ORAL | 0 refills | Status: AC | PRN

## 2024-01-27 NOTE — ED Triage Notes (Signed)
Pt c/o cough, congestion, body aches x 3 days.

## 2024-01-27 NOTE — ED Provider Notes (Signed)
 HPI  SUBJECTIVE:  Sierra Gutierrez is a 43 y.o. female who presents with 3 days of nasal congestion, clear rhinorrhea, body aches, headaches, sore throat, postnasal drip, fatigue, nausea secondary to postnasal drip and cough productive of clear mucus.  Reports diffuse chest soreness secondary to the cough and bilateral ear pain.  She is unable to sleep at night because of the cough.  No fevers, sinus pain or pressure, wheezing, chest pain, shortness of breath, vomiting, diarrhea, abdominal pain.  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  She got 2 doses of the COVID vaccine.  She did not yet get this years flu vaccine.  She has tried NyQuil and cough drops without improvement in her symptoms.  Symptoms are worse at night.  She has no past medical history, no history of asthma.  LMP: 2.5 weeks ago.  Denies the possibility being pregnant.  PCP: Madie Aris    History reviewed. No pertinent past medical history.  Past Surgical History:  Procedure Laterality Date   CESAREAN SECTION      History reviewed. No pertinent family history.  Social History   Tobacco Use   Smoking status: Never   Smokeless tobacco: Never  Vaping Use   Vaping status: Never Used  Substance Use Topics   Alcohol use: No   Drug use: Never    No current facility-administered medications for this encounter.  Current Outpatient Medications:    chlorpheniramine-HYDROcodone (TUSSIONEX) 10-8 MG/5ML, Take 5 mLs by mouth every 12 (twelve) hours as needed for cough., Disp: 60 mL, Rfl: 0   fluticasone (FLONASE) 50 MCG/ACT nasal spray, Place 2 sprays into both nostrils daily., Disp: 16 g, Rfl: 0   ibuprofen (ADVIL) 600 MG tablet, Take 1 tablet (600 mg total) by mouth every 6 (six) hours as needed., Disp: 30 tablet, Rfl: 0   FLUoxetine (PROZAC) 20 MG capsule, Take by mouth., Disp: , Rfl:   No Known Allergies   ROS  As noted in HPI.   Physical Exam  BP 116/87 (BP Location:  Left Arm)   Pulse 74   Temp 98.4 F (36.9 C) (Oral)   Wt 88.6 kg   LMP 01/06/2024   SpO2 100%   BMI 35.74 kg/m   Constitutional: Well developed, well nourished, no acute distress.  Coughing Eyes: PERRL, EOMI, conjunctiva normal bilaterally HENT: Normocephalic, atraumatic,mucus membranes moist.  TMs normal bilaterally.  Erythematous, swollen turbinates on the right side.  Clear nasal congestion.  Normal turbinates left side.  No maxillary, frontal sinus tenderness.  Normal tonsils without exudates.  Uvula midline.  Extensive postnasal drip. Neck: Positive cervical lymphadenopathy Respiratory: Clear to auscultation bilaterally, no rales, no wheezing, no rhonchi.  Positive anterior chest wall tenderness Cardiovascular: Normal rate and rhythm, no murmurs, no gallops, no rubs GI: nondistended skin: No rash, skin intact Musculoskeletal: no deformities Neurologic: Alert & oriented x 3, CN III-XII grossly intact, no motor deficits, sensation grossly intact Psychiatric: Speech and behavior appropriate   ED Course   Medications - No data to display  Orders Placed This Encounter  Procedures   Resp Panel by RT-PCR (Flu A&B, Covid) Anterior Nasal Swab    Standing Status:   Standing    Number of Occurrences:   1   Airborne and Contact precautions    Standing Status:   Standing    Number of Occurrences:   1   Results for orders placed or performed during the hospital encounter of 01/27/24 (from the  past 24 hours)  Resp Panel by RT-PCR (Flu A&B, Covid) Anterior Nasal Swab     Status: None   Collection Time: 01/27/24  1:26 PM   Specimen: Anterior Nasal Swab  Result Value Ref Range   SARS Coronavirus 2 by RT PCR NEGATIVE NEGATIVE   Influenza A by PCR NEGATIVE NEGATIVE   Influenza B by PCR NEGATIVE NEGATIVE   No results found.  ED Clinical Impression  1. Upper respiratory tract infection, unspecified type   2. Encounter for laboratory testing for COVID-19 virus      ED  Assessment/Plan       Narcotic database reviewed for this patient, and feel that the risk/benefit ratio today is favorable for proceeding with a prescription for controlled substance.  No opiate prescriptions in the past 2-years  Patient presents with an upper respiratory infection.  Checking COVID, flu.  She is out of the treatment window for influenza, and her only risk factor for progressing to severe illness with COVID is a BMI above 30.  I feel that she will do well without antivirals although I offered them to her if she wants them.  Tussionex, saline nasal irrigation, Mucinex D, Flonase, Tylenol, with ibuprofen.  Work note.  COVID, flu negative.  Supportive treatment as outlined above  Discussed labs, MDM, treatment plan, and plan for follow-up with patient Discussed sn/sx that should prompt return to the ED. patient agrees with plan.   Meds ordered this encounter  Medications   fluticasone (FLONASE) 50 MCG/ACT nasal spray    Sig: Place 2 sprays into both nostrils daily.    Dispense:  16 g    Refill:  0   ibuprofen (ADVIL) 600 MG tablet    Sig: Take 1 tablet (600 mg total) by mouth every 6 (six) hours as needed.    Dispense:  30 tablet    Refill:  0   chlorpheniramine-HYDROcodone (TUSSIONEX) 10-8 MG/5ML    Sig: Take 5 mLs by mouth every 12 (twelve) hours as needed for cough.    Dispense:  60 mL    Refill:  0      *This clinic note was created using Scientist, clinical (histocompatibility and immunogenetics). Therefore, there may be occasional mistakes despite careful proofreading. ?    Van Knee, MD 01/27/24 [phone]

## 2024-01-27 NOTE — Discharge Instructions (Signed)
 Start Mucinex-D to keep the mucous thin and to decongest you.   You may take 600 mg of motrin with 1000 mg of tylenol up to 3-4 times a day as needed for pain.  Flonase.  Tussionex for cough.  Most respiratory infections are viral and do not need antibiotics unless you have a high fever, have had this for 10 days, or you get better and then get sick again. Use a NeilMed sinus rinse with distilled water as often as you want to to reduce nasal congestion. Follow the directions on the box.   Go to www.goodrx.com to look up your medications. This will give you a list of where you can find your prescriptions at the most affordable prices. Or you can ask the pharmacist what the cash price is. This is frequently cheaper than going through insurance.
